# Patient Record
Sex: Male | Born: 2000 | Race: White | Hispanic: Yes | Marital: Single | Smoking: Never smoker
Health system: Southern US, Community
[De-identification: ages and names within clinical notes are randomized; demographics above are authoritative.]

## PROBLEM LIST (undated history)

## (undated) DIAGNOSIS — F909 Attention-deficit hyperactivity disorder, unspecified type: Secondary | ICD-10-CM

## (undated) DIAGNOSIS — G930 Cerebral cysts: Secondary | ICD-10-CM

---
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
## 2021-03-23 ENCOUNTER — Emergency Department (HOSPITAL_COMMUNITY): Payer: Medicaid Other

## 2021-03-23 ENCOUNTER — Other Ambulatory Visit: Payer: Self-pay

## 2021-03-23 ENCOUNTER — Emergency Department (HOSPITAL_COMMUNITY)
Admission: EM | Admit: 2021-03-23 | Discharge: 2021-03-23 | Disposition: A | Discharge status: Home / Self Care | Payer: Medicaid Other | Attending: Emergency Medicine | Admitting: Emergency Medicine

## 2021-03-23 ENCOUNTER — Encounter (HOSPITAL_COMMUNITY): Payer: Self-pay

## 2021-03-23 DIAGNOSIS — Y93K9 Activity, other involving animal care: Secondary | ICD-10-CM | POA: Diagnosis not present

## 2021-03-23 DIAGNOSIS — S59912A Unspecified injury of left forearm, initial encounter: Secondary | ICD-10-CM | POA: Diagnosis present

## 2021-03-23 DIAGNOSIS — S51812A Laceration without foreign body of left forearm, initial encounter: Secondary | ICD-10-CM | POA: Diagnosis not present

## 2021-03-23 DIAGNOSIS — Z23 Encounter for immunization: Secondary | ICD-10-CM | POA: Diagnosis not present

## 2021-03-23 DIAGNOSIS — W540XXA Bitten by dog, initial encounter: Secondary | ICD-10-CM | POA: Diagnosis not present

## 2021-03-23 MED ORDER — AMOXICILLIN-POT CLAVULANATE 875-125 MG PO TABS: 1.0000 | ORAL_TABLET | Freq: Two times a day (BID) | ORAL | 0 refills | Status: AC

## 2021-03-23 MED ORDER — TETANUS-DIPHTH-ACELL PERTUSSIS 5-2.5-18.5 LF-MCG/0.5 IM SUSY
0.5000 mL | PREFILLED_SYRINGE | Freq: Once | INTRAMUSCULAR | Status: AC
  Administered 2021-03-23: 0.5 mL via INTRAMUSCULAR
  Filled 2021-03-23: qty 0.5

## 2021-03-23 MED ORDER — AMOXICILLIN-POT CLAVULANATE 875-125 MG PO TABS
1.0000 | ORAL_TABLET | Freq: Once | ORAL | Status: AC
  Administered 2021-03-23: 1 via ORAL
  Filled 2021-03-23: qty 1

## 2021-03-23 MED ORDER — ACETAMINOPHEN 325 MG PO TABS
650.0000 mg | ORAL_TABLET | Freq: Once | ORAL | Status: DC
  Filled 2021-03-23: qty 2

## 2021-03-23 MED ORDER — OXYCODONE-ACETAMINOPHEN 5-325 MG PO TABS
1.0000 | ORAL_TABLET | Freq: Once | ORAL | Status: AC
  Administered 2021-03-23: 1 via ORAL
  Filled 2021-03-23: qty 1

## 2021-03-23 NOTE — ED Provider Notes (Addendum)
[facility] EMERGENCY DEPARTMENT Provider Note   CSN: [id number] Arrival date & time: 03/23/21  1044     History No chief complaint on file.   Evan Burnett is a 20 y.o. male who presents with concern for dog bite to left forearm immediately prior to arrival to the emergency department.  States that his dog was in an altercation with another dog and when he pulled his dog away his own animal bit his forearm.  The dog is up-to-date on his vaccines including rabies, however the patient is unsure when his last tetanus vaccine was.  He denies any numbness, ting, weakness in the hand but is worse pain in the forearm with 3 puncture wounds.  I personally reads patient medical records.  He has history of ADHD, he is not on any medications every day.  HPI     Past Medical History:  Diagnosis Date  . ADHD (attention deficit hyperactivity disorder)   . Cyst of brain     There are no problems to display for this patient.   History reviewed. No pertinent surgical history.     No family history on file.     Home Medications Prior to Admission medications   Medication Sig Start Date End Date Taking? Authorizing Provider  amoxicillin-clavulanate (AUGMENTIN) 875-125 MG tablet Take 1 tablet by mouth every 12 (twelve) hours for 7 days. 03/23/21 03/30/21 Yes Nyimah Shadduck, Lupe Carney R, PA-C  lactobacillus acidophilus & bulgar (LACTINEX) chewable tablet Chew 1 tablet by mouth 3 (three) times daily with meals. Patient not taking: Reported on 02/22/2020 09/17/17   Viviano Simas, NP  ondansetron (ZOFRAN ODT) 4 MG disintegrating tablet Take 1 tablet (4 mg total) by mouth every 8 (eight) hours as needed. Patient not taking: Reported on 02/22/2020 09/17/17   Viviano Simas, NP    Allergies    Patient has no known allergies.  Review of Systems   Review of Systems  Constitutional: Negative.   HENT: Negative.    Respiratory: Negative.    Cardiovascular: Negative.    Gastrointestinal: Negative.   Musculoskeletal: Negative.  Negative for myalgias.  Skin:  Positive for wound.  Neurological: Negative.    Physical Exam Updated Vital Signs BP 130/71 (BP Location: Right Arm)   Pulse 62   Temp 97.9 F (36.6 C) (Oral)   Resp 15   SpO2 100%   Physical Exam Vitals and nursing note reviewed.  HENT:     Head: Normocephalic and atraumatic.  Eyes:     General: No scleral icterus.       Right eye: No discharge.        Left eye: No discharge.     Conjunctiva/sclera: Conjunctivae normal.  Pulmonary:     Effort: Pulmonary effort is normal.  Musculoskeletal:     Right forearm: Normal.     Left forearm: Swelling, laceration and tenderness present. No bony tenderness.     Right wrist: Normal.     Left wrist: Normal.     Right hand: Normal.     Left hand: Normal.       Arms:  Skin:    General: Skin is warm and dry.     Capillary Refill: Capillary refill takes less than 2 seconds.     Findings: Laceration present.  Neurological:     General: No focal deficit present.     Mental Status: He is alert.  Psychiatric:        Mood and Affect: Mood normal.    ED  Results / Procedures / Treatments   Labs (all labs ordered are listed, but only abnormal results are displayed) Labs Reviewed - No data to display  EKG None  Radiology DG Forearm Left  Result Date: 03/23/2021 CLINICAL DATA:  Dog bite. EXAM: LEFT FOREARM - 2 VIEW COMPARISON:  None. FINDINGS: Soft tissue gas in the right forearm is consistent with the history of dog bite in laceration. No foreign body identified. No fractures. No other abnormalities. IMPRESSION: Soft tissue gas consistent with history of dog bite/laceration. No foreign body or fracture. Electronically Signed   By: Gerome Sam III M.D.   On: 03/23/2021 12:01    Procedures .Marland KitchenLaceration Repair  Date/Time: 03/23/2021 3:43 PM Performed by: Paris Lore, PA-C Authorized by: Paris Lore, PA-C   Consent:     Consent obtained:  Verbal   Consent given by:  Patient   Risks discussed:  Infection, need for additional repair, pain, poor cosmetic result and poor wound healing   Alternatives discussed:  No treatment and delayed treatment Universal protocol:    Procedure explained and questions answered to patient or proxy's satisfaction: yes     Relevant documents present and verified: yes     Test results available: yes     Imaging studies available: yes     Required blood products, implants, devices, and special equipment available: yes     Site/side marked: yes     Immediately prior to procedure, a time out was called: yes     Patient identity confirmed:  Verbally with patient Laceration details:    Length (cm):  0.5 Exploration:    Imaging obtained: x-ray     Imaging outcome: foreign body not noted     Wound extent: no foreign bodies/material noted, no tendon damage noted and no underlying fracture noted   Treatment:    Area cleansed with:  Saline   Amount of cleaning:  Extensive   Irrigation solution:  Sterile saline   Irrigation volume:  500 cc   Irrigation method:  Pressure wash Approximation:    Laceration repair approximation: Wounds left open. Post-procedure details:    Dressing:  Antibiotic ointment and non-adherent dressing   Procedure completion:  Tolerated well, no immediate complications Comments:     Wound thoroughly irrigated and left open given dog bite history and none gaping puncture wounds without muscle belly exposure or tendinous/ligamentous injury.   Medications Ordered in ED Medications  Tdap (BOOSTRIX) injection 0.5 mL (0.5 mLs Intramuscular Given 03/23/21 1408)  oxyCODONE-acetaminophen (PERCOCET/ROXICET) 5-325 MG per tablet 1 tablet (1 tablet Oral Given 03/23/21 1458)  amoxicillin-clavulanate (AUGMENTIN) 875-125 MG per tablet 1 tablet (1 tablet Oral Given 03/23/21 1458)    ED Course  I have reviewed the triage vital signs and the nursing notes.  Pertinent labs  & imaging results that were available during my care of the patient were reviewed by me and considered in my medical decision making (see chart for details).    MDM Rules/Calculators/A&P                         21 year old male presents with concern for dog bite sustained by his own animal who is up-to-date on his vaccines.  Physical exam reassuring.  Hypertensive on intake vitals otherwise normal.  2+ radial pulses bilaterally, normal capillary refill and sensation all 5 digits of the left hand.  Normal range of motion of the left wrist.  Normal range of motion of the left elbow.  3 puncture wounds to the left forearm with surrounding hematoma of the largest as above.  Skin crepitus around the largest puncture wound immediately adjacent to the wound.  Tetanus boosted from triage.  Plain film of the forearm reassuring without retained foreign body with some soft tissue gas secondary to lacerations.  First dose of Augmentin administered in the emergency department.  Wound thoroughly irrigated as above, antibiotic ointment administered.  No further work-up warranted in the this time.  Will discharge with course of Augmentin.  Isamu voiced understanding of his medical evaluation and treatment plan.  Each of his questions answered to his expressed satisfaction.  Strict return precautions are given.  Patient is well-appearing, stable, and appropriate for discharge at this time.  This chart was dictated using voice recognition software, Dragon. Despite the best efforts of this provider to proofread and correct errors, errors may still occur which can change documentation meaning.   Final Clinical Impression(s) / ED Diagnoses Final diagnoses:  Dog bite, initial encounter    Rx / DC Orders ED Discharge Orders          Ordered    amoxicillin-clavulanate (AUGMENTIN) 875-125 MG tablet  Every 12 hours        03/23/21 1536             Jlyn Bracamonte, Eugene Gavia, PA-C 03/23/21 1544    Milagros Loll, MD 03/24/21 0845    Eeva Schlosser, Eugene Gavia, PA-C 04/04/21 1433    Milagros Loll, MD 04/07/21 [phone]

## 2021-03-23 NOTE — ED Provider Notes (Signed)
Emergency Medicine Provider Triage Evaluation Note  Evan Burnett , a 20 y.o. male  was evaluated in triage.  Pt complains of a dog bite to the left forearm that occurred approximately 1 hour ago.  States that his dog was getting into it with another dog and he tried to pull the dog away and he was bit.  Tetanus is out of date.  No weakness/numbness to the left extremity. 8/10 pain.   Review of Systems  Positive:  Negative: See above  Physical Exam  BP (!) 148/86 (BP Location: Right Arm)   Pulse 64   Temp 98.2 F (36.8 C) (Oral)   Resp 16   SpO2 100%  Gen:   Awake, no distress   Resp:  Normal effort  MSK:   Moves extremities without difficulty  Other:  2 puncture wounds to the left forearm.  He is neurovascularly intact distal to the injury.  Normal range of motion to the hand and fingers.  Medical Decision Making  Medically screening exam initiated at 11:10 AM.  Appropriate orders placed.  Evan Burnett was informed that the remainder of the evaluation will be completed by another provider, this initial triage assessment does not replace that evaluation, and the importance of remaining in the ED until their evaluation is complete.     Evan Lower, PA-C 03/23/21 1112    Evan Burnett P, DO 03/23/21 1127

## 2021-03-23 NOTE — Discharge Instructions (Addendum)
You are seen in the ER today for your dog bite.  You were given your first dose of antibiotics.  Your tetanus was also updated.  You have been prescribed antibiotics to take every day for the next week.  Please take them as prescribed for the entire course.  Please return to the emergency department or urgent care immediately for develop any new puslike drainage from the wounds, redness or swelling extending down the hand or up towards the shoulder, nausea or vomiting, fevers or chills, or any other new severe symptom.

## 2021-03-23 NOTE — ED Triage Notes (Signed)
Patient complains of dog bite to left forearm today. Patient states it was his dog and up to date on vaccines. Puncture x 2 noted with minimal bleeding

## 2022-11-07 IMAGING — DX DG FOREARM 2V*L*
2 series · 2 of 2 positions shown · non-contrast
Comparison: None.

CLINICAL DATA: Dog bite.

EXAM:
LEFT FOREARM - 2 VIEW

[forearm ap]
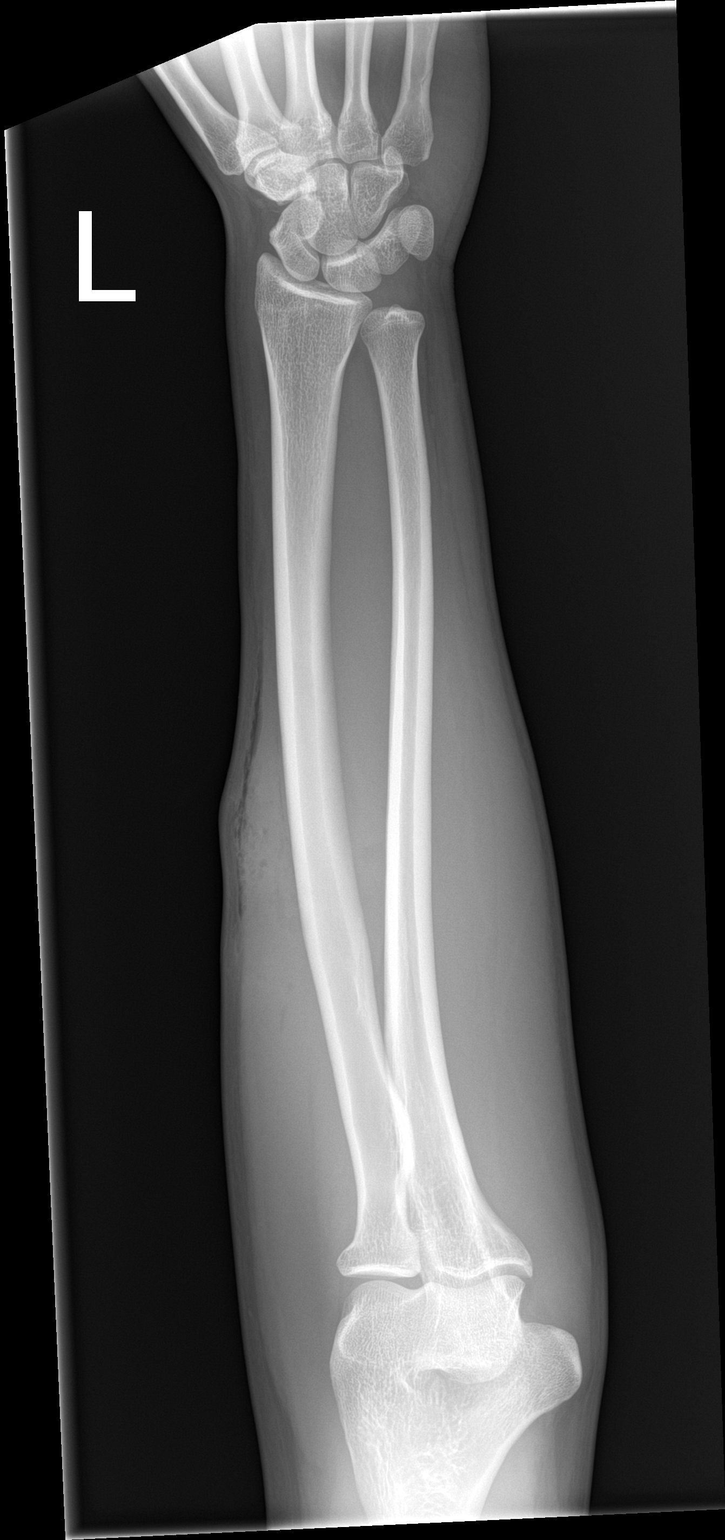

[forearm lat]
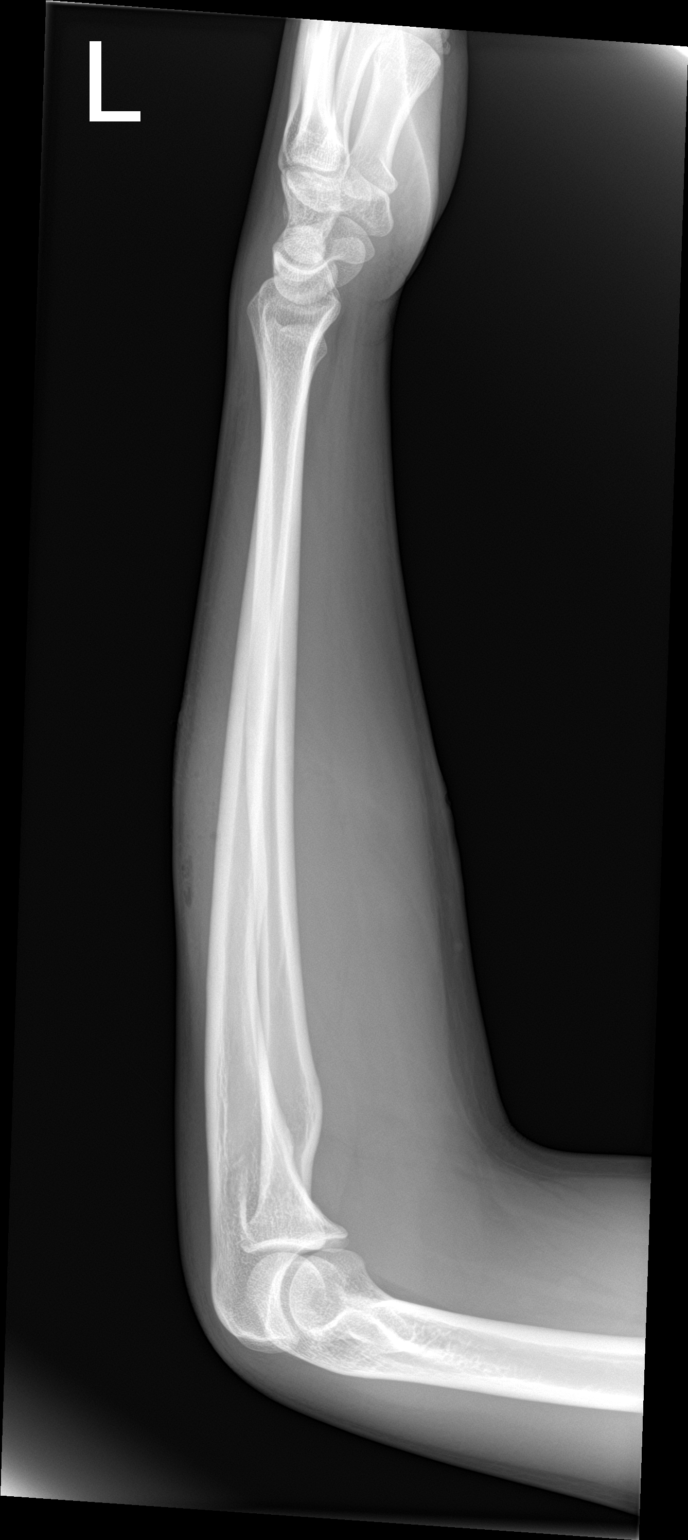

[2 of 2 positions shown; findings below may reference images not displayed]

FINDINGS: Soft tissue gas in the right forearm is consistent with the history
of dog bite in laceration. No foreign body identified. No fractures.
No other abnormalities.
IMPRESSION: Soft tissue gas consistent with history of dog bite/laceration. No
foreign body or fracture.

## 2023-03-18 ENCOUNTER — Ambulatory Visit (HOSPITAL_COMMUNITY)
Admission: EM | Admit: 2023-03-18 | Discharge: 2023-03-18 | Disposition: A | Discharge status: Home / Self Care | Payer: Medicaid Other | Attending: Emergency Medicine | Admitting: Emergency Medicine

## 2023-03-18 ENCOUNTER — Encounter (HOSPITAL_COMMUNITY): Payer: Self-pay

## 2023-03-18 DIAGNOSIS — L0100 Impetigo, unspecified: Secondary | ICD-10-CM

## 2023-03-18 MED ORDER — DOXYCYCLINE HYCLATE 100 MG PO CAPS: 100.0000 mg | ORAL_CAPSULE | Freq: Two times a day (BID) | ORAL | 0 refills | Status: AC

## 2023-03-18 MED ORDER — MUPIROCIN 2 % EX OINT: 1.0000 | TOPICAL_OINTMENT | Freq: Three times a day (TID) | CUTANEOUS | 0 refills | Status: AC

## 2023-03-18 NOTE — ED Triage Notes (Signed)
Here for blister on the right side of his check x 1 week.

## 2023-03-18 NOTE — Medical Student Note (Signed)
[facility] Insurance account manager Note For educational purposes for Medical, PA and NP students only and not part of the legal medical record.   CSN: [id number] Arrival date & time: 03/18/23  4166      History   Chief Complaint Chief Complaint  Patient presents with   Blister    HPI Evan Burnett is a 22 y.o. male with concerns of lesion on his right cheek, right ear lobe, and left shoulder that began on 03/14/23. Patient reports first noticing pimple on right cheek which he popped. He does report noticing lesions after going to the gym which he regularly frequents. He is unsure of the onset of the other lesions. Patient states lesions are not painful or pruritic. He has not attempted to treat lesions.   HPI  Past Medical History:  Diagnosis Date   ADHD (attention deficit hyperactivity disorder)    Cyst of brain     There are no problems to display for this patient.   History reviewed. No pertinent surgical history.     Home Medications    Prior to Admission medications   Medication Sig Start Date End Date Taking? Authorizing Provider  doxycycline (VIBRAMYCIN) 100 MG capsule Take 1 capsule (100 mg total) by mouth 2 (two) times daily. 03/18/23  Yes Cathlyn Parsons, NP  mupirocin ointment (BACTROBAN) 2 % Apply 1 Application topically 3 (three) times daily for 7 days. 03/18/23 03/25/23 Yes Cathlyn Parsons, NP  lactobacillus acidophilus & bulgar (LACTINEX) chewable tablet Chew 1 tablet by mouth 3 (three) times daily with meals. Patient not taking: Reported on 02/22/2020 09/17/17   Viviano Simas, NP  ondansetron (ZOFRAN ODT) 4 MG disintegrating tablet Take 1 tablet (4 mg total) by mouth every 8 (eight) hours as needed. Patient not taking: Reported on 02/22/2020 09/17/17   Viviano Simas, NP    Family History History reviewed. No pertinent family history.  Social History Social History   Tobacco Use   Smoking status: Never   Smokeless tobacco: Never      Allergies   Patient has no known allergies.   Review of Systems Review of Systems   Physical Exam Updated Vital Signs BP 124/72 (BP Location: Left Arm)   Pulse (!) 56   Temp 98.1 F (36.7 C) (Oral)   Resp 16   SpO2 98%   Physical Exam Constitutional:      Appearance: Normal appearance.  HENT:     Head: Normocephalic and atraumatic.  Cardiovascular:     Rate and Rhythm: Normal rate and regular rhythm.  Pulmonary:     Effort: Pulmonary effort is normal.     Breath sounds: Normal breath sounds.  Skin:    General: Skin is warm and dry.     Findings: Lesion present.          Comments: Yellow crusting, ulcerated, scabbing lesions on right cheek, right ear lobe, and right upper shoulder.   Neurological:     General: No focal deficit present.     Mental Status: He is alert and oriented to person, place, and time.      ED Treatments / Results  Labs (all labs ordered are listed, but only abnormal results are displayed) Labs Reviewed - No data to display  EKG  Radiology No results found.  Procedures Procedures (including critical care time)  Medications Ordered in ED Medications - No data to display   Initial Impression / Assessment and Plan / ED Course  I have reviewed the  triage vital signs and the nursing notes.  Pertinent labs & imaging results that were available during my care of the patient were reviewed by me and considered in my medical decision making (see chart for details).   Due to presentation of lesions, patient's presentation likely associated with Impetigo. With gym history, will prescribe Doxycycline for MRSA covered.     Final Clinical Impressions(s) / ED Diagnoses   Final diagnoses:  Impetigo    New Prescriptions Discharge Medication List as of 03/18/2023  9:28 AM     START taking these medications   Details  doxycycline (VIBRAMYCIN) 100 MG capsule Take 1 capsule (100 mg total) by mouth 2 (two) times daily., Starting Thu  03/18/2023, Normal    mupirocin ointment (BACTROBAN) 2 % Apply 1 Application topically 3 (three) times daily for 7 days., Starting Thu 03/18/2023, Until Thu 03/25/2023, Normal

## 2023-03-22 NOTE — ED Provider Notes (Signed)
[facility]    CSN: [id number] Arrival date & time: 03/18/23  4098      History   Chief Complaint Chief Complaint  Patient presents with   Blister    HPI Evan Burnett is a 22 y.o. male with concerns of lesion on his right face near his lips, right ear lobe, and left shoulder that began on 03/14/23. Patient reports first noticing pimple on right cheek which he popped. 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 He is unsure of the onset of the other lesions. Patient states lesions are not painful or pruritic. He has not attempted to treat lesions.   HPI  Past Medical History:  Diagnosis Date   ADHD (attention deficit hyperactivity disorder)    Cyst of brain     There are no problems to display for this patient.   History reviewed. No pertinent surgical history.     Home Medications    Prior to Admission medications   Medication Sig Start Date End Date Taking? Authorizing Provider  doxycycline (VIBRAMYCIN) 100 MG capsule Take 1 capsule (100 mg total) by mouth 2 (two) times daily. 03/18/23  Yes Cathlyn Parsons, NP  mupirocin ointment (BACTROBAN) 2 % Apply 1 Application topically 3 (three) times daily for 7 days. 03/18/23 03/25/23 Yes Cathlyn Parsons, NP  lactobacillus acidophilus & bulgar (LACTINEX) chewable tablet Chew 1 tablet by mouth 3 (three) times daily with meals. Patient not taking: Reported on 02/22/2020 09/17/17   Viviano Simas, NP  ondansetron (ZOFRAN ODT) 4 MG disintegrating tablet Take 1 tablet (4 mg total) by mouth every 8 (eight) hours as needed. Patient not taking: Reported on 02/22/2020 09/17/17   Viviano Simas, NP    Family History History reviewed. No pertinent family history.  Social History Social History   Tobacco Use   Smoking status: Never   Smokeless tobacco: Never     Allergies   Patient has no known allergies.   Review of Systems Review of  Systems   Physical Exam Triage Vital Signs ED Triage Vitals [03/18/23 0906]  Encounter Vitals Group     BP 124/72     Systolic BP Percentile      Diastolic BP Percentile      Pulse Rate (!) 56     Resp 16     Temp 98.1 F (36.7 C)     Temp Source Oral     SpO2 98 %     Weight      Height      Head Circumference      Peak Flow      Pain Score      Pain Loc      Pain Education      Exclude from Growth Chart    No data found.  Updated Vital Signs BP 124/72 (BP Location: Left Arm)   Pulse (!) 56   Temp 98.1 F (36.7 C) (Oral)   Resp 16   SpO2 98%   Visual Acuity Right Eye Distance:   Left Eye Distance:   Bilateral Distance:    Right Eye Near:   Left Eye Near:    Bilateral Near:     Physical Exam Constitutional:      Appearance: Normal appearance.  HENT:     Head: Normocephalic and atraumatic.  Pulmonary:     Effort: Pulmonary effort is normal.  Skin:    General: Skin is warm  and dry.     Findings: Abrasion and rash present. Rash is crusting.     Comments: Yellow crusting, ulcerated, scabbing lesions on right cheek near lips/R oral commissure. Also has abrasions on right ear lobe and right upper shoulder.    Neurological:     Mental Status: He is alert.      UC Treatments / Results  Labs (all labs ordered are listed, but only abnormal results are displayed) Labs Reviewed - No data to display  EKG   Radiology No results found.  Procedures Procedures (including critical care time)  Medications Ordered in UC Medications - No data to display  Initial Impression / Assessment and Plan / UC Course  I have reviewed the triage vital signs and the nursing notes.  Pertinent labs & imaging results that were available during my care of the patient were reviewed by me and considered in my medical decision making (see chart for details).    I suspect impetigo on R face. Other lesions look more like abrasions only at this time. I am concerned for MRSA  infection as pt frequents a gym. Discussed keeping wounds covered and using mupirocin ointment on all of them, rx doxycycline orally. Discussed skin hygiene and careful monitoring of skin given cage fighting and gym environment.   Final Clinical Impressions(s) / UC Diagnoses   Final diagnoses:  Impetigo   Discharge Instructions   None    ED Prescriptions     Medication Sig Dispense Auth. Provider   doxycycline (VIBRAMYCIN) 100 MG capsule Take 1 capsule (100 mg total) by mouth 2 (two) times daily. 20 capsule Cathlyn Parsons, NP   mupirocin ointment (BACTROBAN) 2 % Apply 1 Application topically 3 (three) times daily for 7 days. 22 g Cathlyn Parsons, NP      PDMP not reviewed this encounter.   Cathlyn Parsons, NP 03/22/23 [id number]
